# Patient Record
Sex: Male | Born: 2000 | Race: Black or African American | Hispanic: No | Marital: Single | Smoking: Never smoker
Health system: Southern US, Community
[De-identification: ages and names within clinical notes are randomized; demographics above are authoritative.]

---
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
## 2019-10-22 IMAGING — CT CT RENAL STONE PROTOCOL
2 of 4 series · 17 of 46 positions shown, 19 images · non-contrast
Comparison: None.

CLINICAL DATA: Left flank pain times 1 day.

EXAM:
CT ABDOMEN AND PELVIS WITHOUT CONTRAST
TECHNIQUE: Multidetector CT imaging of the abdomen and pelvis was performed
following the standard protocol without IV contrast.

[Series 2: stone full standard · axial · 0.80mm/px · z∈[-505,-45]mm · 14 of 102 slices shown, 16 images]
[im 5/102  soft-tissue]
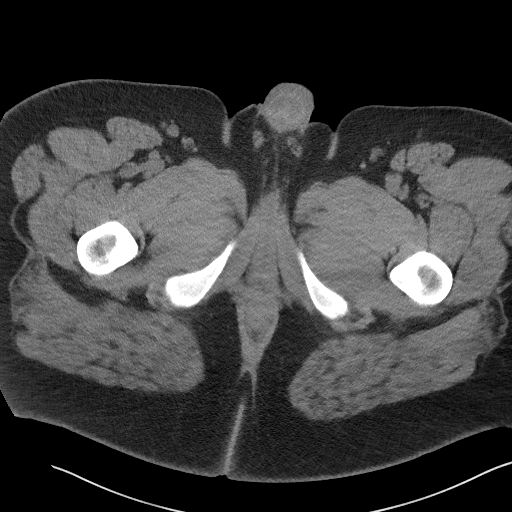
[im 5/102  bone]
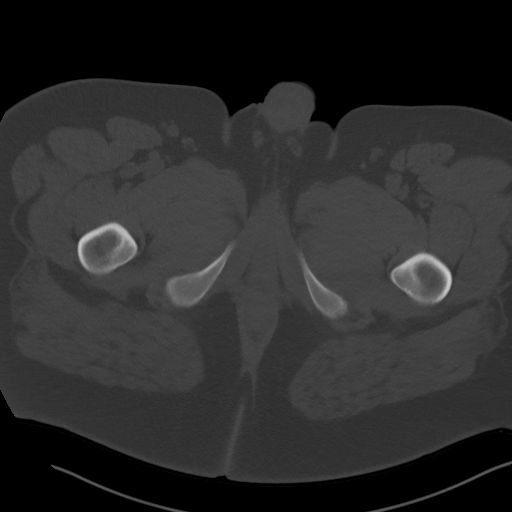
[im 13/102  soft-tissue]
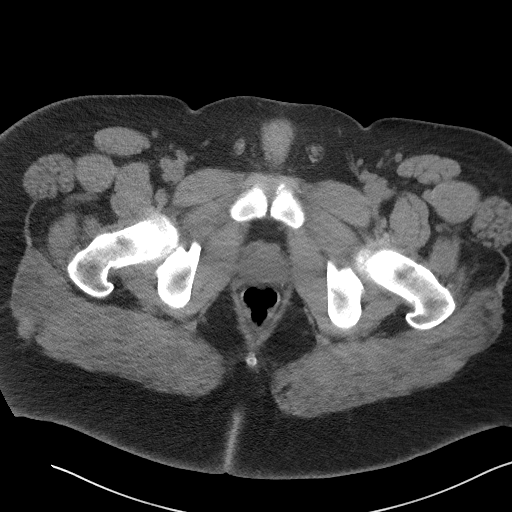
[im 21/102  soft-tissue]
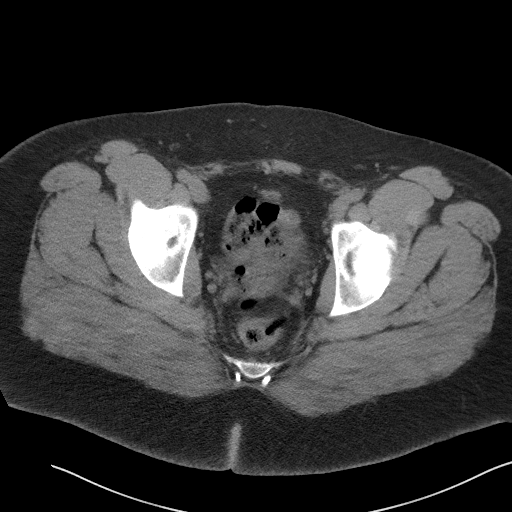
[im 29/102  soft-tissue]
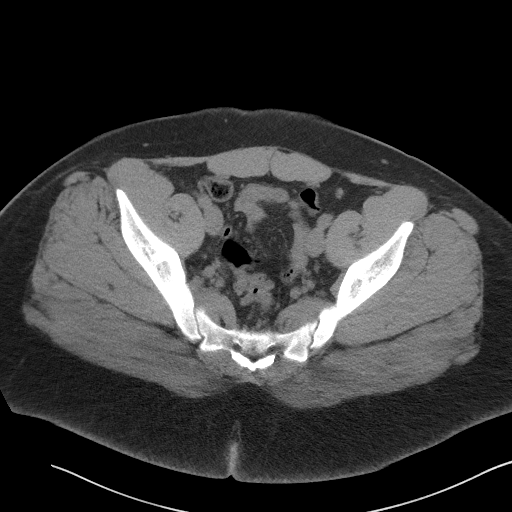
[im 33/102  soft-tissue]
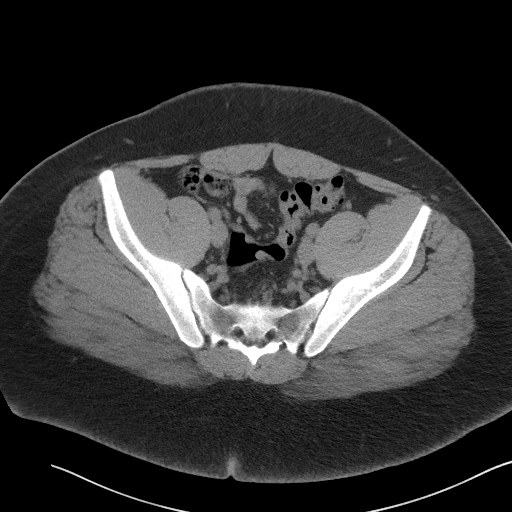
[im 41/102  soft-tissue]
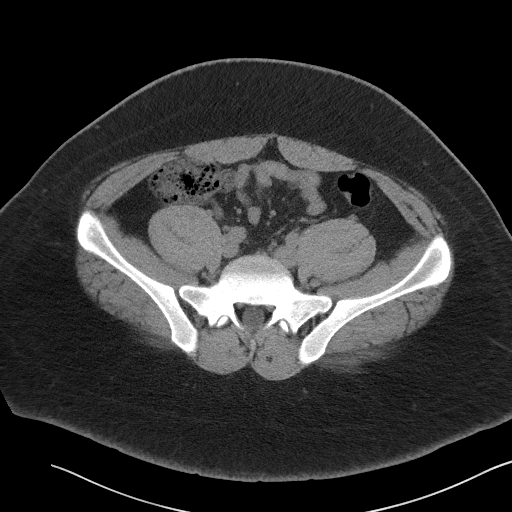
[im 49/102  soft-tissue]
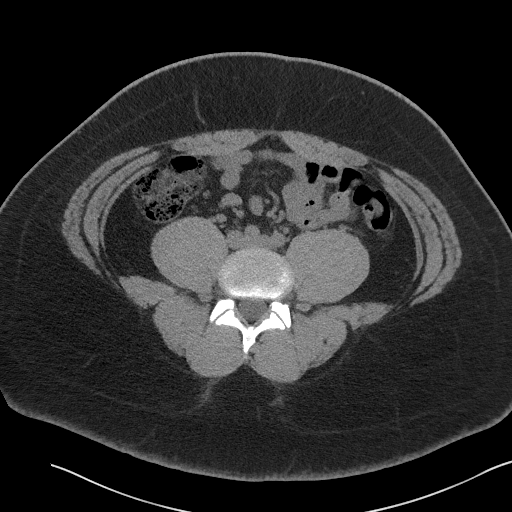
[im 53/102  soft-tissue]
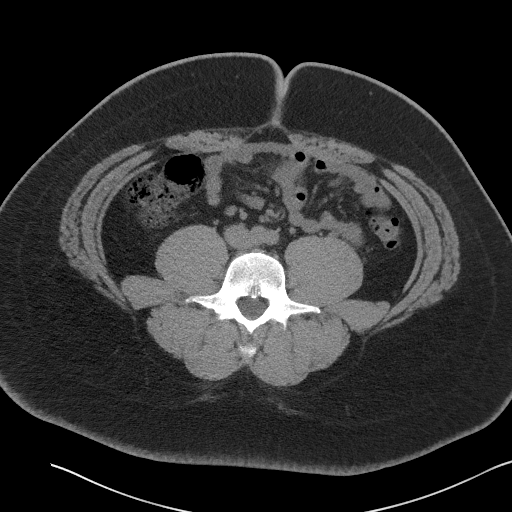
[im 61/102  soft-tissue]
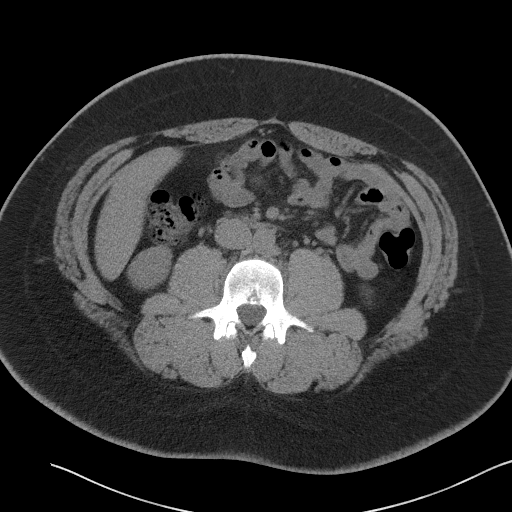
[im 61/102  bone]
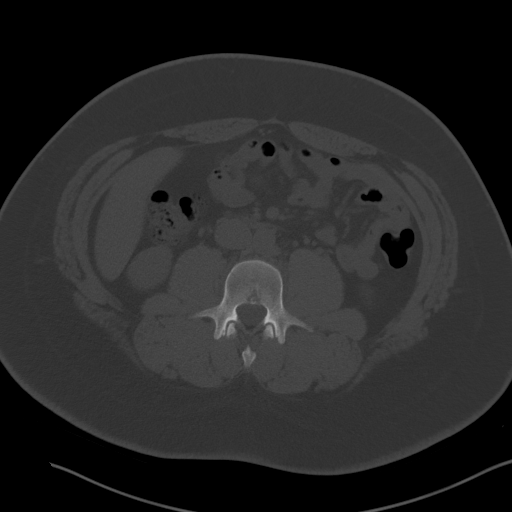
[im 69/102  soft-tissue]
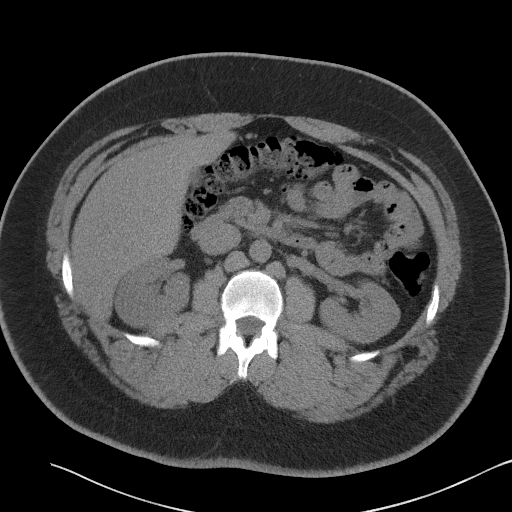
[im 77/102  soft-tissue]
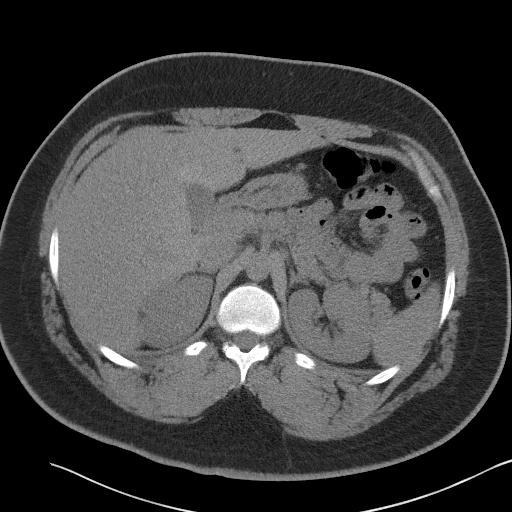
[im 81/102  soft-tissue]
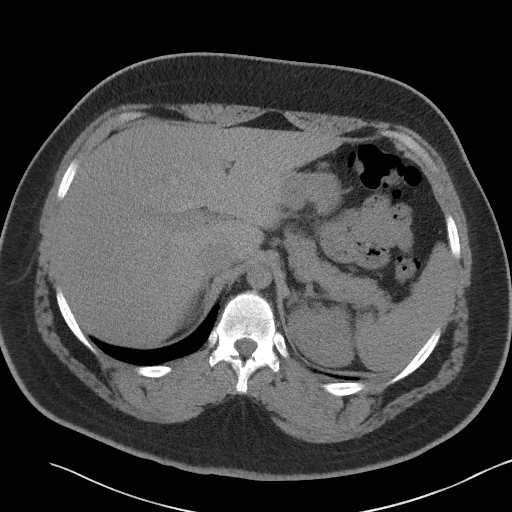
[im 89/102  soft-tissue]
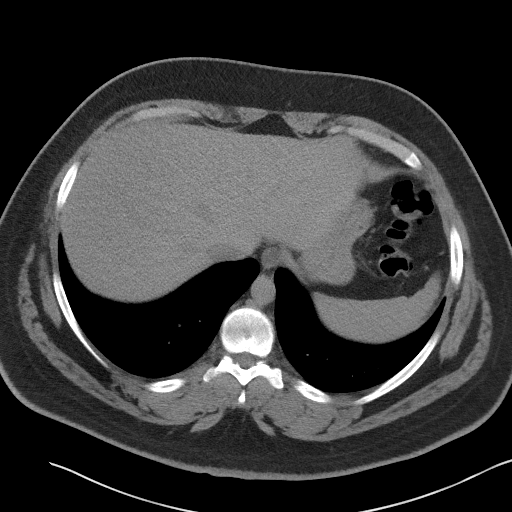
[im 97/102  soft-tissue]
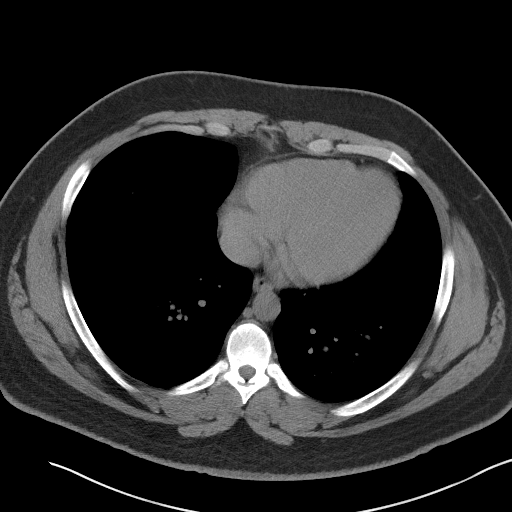

[Series 5: coronal · coronal · 0.98mm/px · 3 of 174 slices shown]
[im 58/174  soft-tissue]
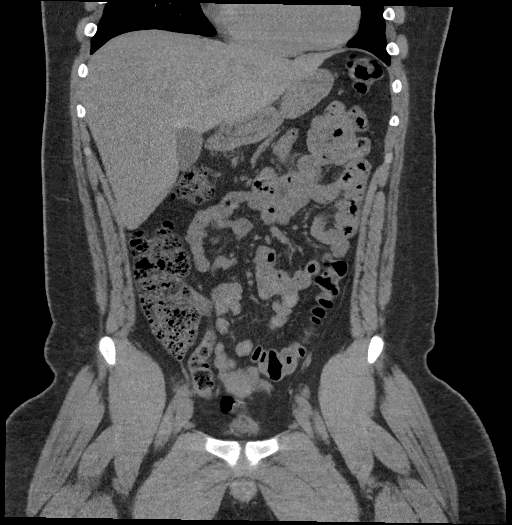
[im 77/174  soft-tissue]
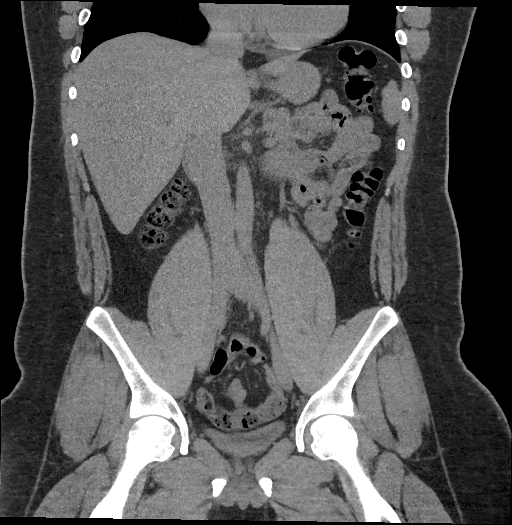
[im 97/174  soft-tissue]
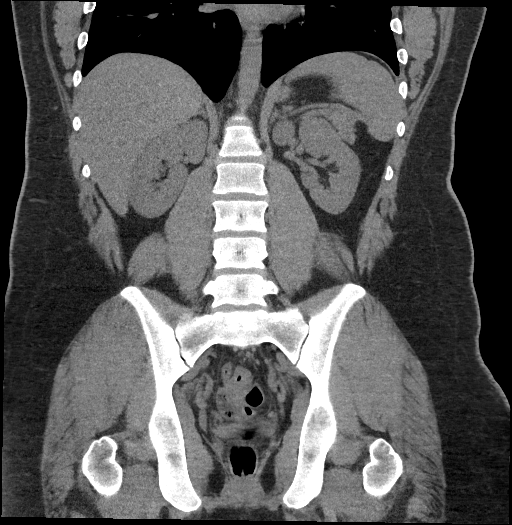

[17 of 46 positions shown; findings below may reference images not displayed]

FINDINGS: Lower chest: No acute abnormality.

Hepatobiliary: No focal liver abnormality is seen. No gallstones,
gallbladder wall thickening, or biliary dilatation.

Pancreas: Unremarkable. No pancreatic ductal dilatation or
surrounding inflammatory changes.

Spleen: Normal in size without focal abnormality.

Adrenals/Urinary Tract: Adrenal glands are unremarkable. Kidneys are
normal, without renal calculi, focal lesion, or hydronephrosis.
Bladder is unremarkable.

Stomach/Bowel: Stomach is within normal limits. Appendix appears
normal. Moderate colonic stool burden within the colon from cecum to
mid descending. No evidence of bowel wall thickening, distention, or
inflammatory changes.

Vascular/Lymphatic: No significant vascular findings are present. No
enlarged abdominal or pelvic lymph nodes.

Reproductive: Prostate is unremarkable.

Other: No abdominal wall hernia or abnormality. No abdominopelvic
ascites.

Musculoskeletal: No acute or significant osseous findings.
IMPRESSION: 1. Increased colonic stool burden. No bowel obstruction or
inflammation.
2. Normal appearance of the kidneys and renal collecting system. No
nephrolithiasis.

## 2021-06-15 ENCOUNTER — Other Ambulatory Visit: Payer: Self-pay

## 2021-06-15 ENCOUNTER — Emergency Department
Admission: EM | Admit: 2021-06-15 | Discharge: 2021-06-15 | Disposition: A | Discharge status: Home / Self Care | Payer: Medicaid Other | Attending: Emergency Medicine | Admitting: Emergency Medicine

## 2021-06-15 ENCOUNTER — Encounter: Payer: Self-pay | Admitting: Emergency Medicine

## 2021-06-15 ENCOUNTER — Emergency Department: Payer: Medicaid Other

## 2021-06-15 DIAGNOSIS — Z20822 Contact with and (suspected) exposure to covid-19: Secondary | ICD-10-CM | POA: Diagnosis not present

## 2021-06-15 DIAGNOSIS — J029 Acute pharyngitis, unspecified: Secondary | ICD-10-CM | POA: Insufficient documentation

## 2021-06-15 LAB — COMPREHENSIVE METABOLIC PANEL
ALT: 16 U/L (ref 0–44)
AST: 19 U/L (ref 15–41)
Albumin: 4 g/dL (ref 3.5–5.0)
Alkaline Phosphatase: 70 U/L (ref 38–126)
Anion gap: 8 (ref 5–15)
BUN: 10 mg/dL (ref 6–20)
CO2: 27 mmol/L (ref 22–32)
Calcium: 9.3 mg/dL (ref 8.9–10.3)
Chloride: 98 mmol/L (ref 98–111)
Creatinine, Ser: 1.08 mg/dL (ref 0.61–1.24)
GFR, Estimated: >60 mL/min (ref >60)
Glucose, Bld: 119 mg/dL — ABNORMAL HIGH (ref 70–99)
Potassium: 3.5 mmol/L (ref 3.5–5.1)
Sodium: 133 mmol/L — ABNORMAL LOW (ref 135–145)
Total Bilirubin: 0.5 mg/dL (ref 0.3–1.2)
Total Protein: 8.1 g/dL (ref 6.5–8.1)

## 2021-06-15 LAB — CBC
HCT: 42.1 % (ref 39.0–52.0)
Hemoglobin: 14.1 g/dL (ref 13.0–17.0)
MCH: 27.6 pg (ref 26.0–34.0)
MCHC: 33.5 g/dL (ref 30.0–36.0)
MCV: 82.5 fL (ref 80.0–100.0)
Platelets: 354 10*3/uL (ref 150–400)
RBC: 5.1 MIL/uL (ref 4.22–5.81)
RDW: 12.4 % (ref 11.5–15.5)
WBC: 9.1 10*3/uL (ref 4.0–10.5)
nRBC: 0 % (ref 0.0–0.2)

## 2021-06-15 LAB — RESP PANEL BY RT-PCR (FLU A&B, COVID) ARPGX2
Influenza A by PCR: NEGATIVE
Influenza B by PCR: NEGATIVE
SARS Coronavirus 2 by RT PCR: NEGATIVE

## 2021-06-15 LAB — GROUP A STREP BY PCR: Group A Strep by PCR: NOT DETECTED

## 2021-06-15 MED ORDER — IOHEXOL 300 MG/ML  SOLN
75.0000 mL | Freq: Once | INTRAMUSCULAR | Status: AC | PRN
  Administered 2021-06-15: 75 mL via INTRAVENOUS

## 2021-06-15 MED ORDER — CEPHALEXIN 500 MG PO CAPS: 500.0000 mg | ORAL_CAPSULE | Freq: Three times a day (TID) | ORAL | 0 refills | Status: AC

## 2021-06-15 NOTE — ED Triage Notes (Signed)
Pt arrived via POV with reports of pain when swallowing on the R side, pt states the pain in his throat started today, pt states 1 week ago at urgent care he tested + for the flu and - for strep.   Pt was also told that he had tonsil stones as well.

## 2021-06-15 NOTE — Discharge Instructions (Addendum)
Take the antibiotic 1 pill 3 times a day.  Use sore throat lozenges or sore throat gargles.  Return here if you are worse and follow-up with your doctor in about a week if you are not well.  Tylenol or Motrin may work for the pain as well.  If it is hard to swallow you can try the liquid Children's Motrin.  You can take up to 6 or 800 mg of Motrin 3 times a day.  The liquid Motrin for kids is 100 mg/tsp. so you can take 6 or 8 teaspoons of that a day if you want or if she can swallow pills 3 or 4 of the over-the-counter pills 3 times a day.  Do not take the Motrin for more than 3 to 4 days ago.  Be careful can upset your stomach.  Try taking with food.

## 2021-06-15 NOTE — ED Provider Notes (Signed)
----------------------------------------- °  8:18 AM on 06/15/2021 ----------------------------------------- Patient CT comes back showing only some possible sinusitis and pharyngitis.  Tonsillitis.  On exam patient's right tonsil is very large and looks somewhat covered with exudate.  Strep test is negative but because of the appearance of the tonsil I will give him some Keflex.  Patient will return if worse and follow-up with his doctor if no better in a week.  He will use Tylenol or Motrin for pain and sore throat lozenges and salt water gargles.  He knows not to swallow the salt water.   Arnaldo Natal, MD 06/15/21 [phone]

## 2021-06-15 NOTE — ED Provider Notes (Signed)
[facility] Provider Note    Event Date/Time   First MD Initiated Contact with Patient 06/15/21 [phone]     (approximate)   History   Sore Throat   HPI  Phillip Dudley is a 21 y.o. male who reports no chronic medical issues and presents for evaluation of of sore throat and feeling like something is stuck in his throat.  Recently tested positive for influenza as an outpatient.  His sore throat and foreign body sensation in the right side of his throat has continued to get worse.  He thought he should get it checked out.  His voice is not hoarse and he is not having difficulty swallowing although it is painful to do so.  No fever.  No difficulty breathing.  No swelling in his mouth or his throat.     Physical Exam   Triage Vital Signs: ED Triage Vitals  Enc Vitals Group     BP 06/15/21 0237 (!) 159/86     Pulse Rate 06/15/21 0237 92     Resp 06/15/21 0237 18     Temp 06/15/21 0237 98.6 F (37 C)     Temp Source 06/15/21 0237 Oral     SpO2 06/15/21 0237 100 %     Weight 06/15/21 0237 (!) 161.9 kg (357 lb)     Height 06/15/21 0237 1.905 m (6\' 3" )     Head Circumference --      Peak Flow --      Pain Score 06/15/21 0244 6     Pain Loc --      Pain Edu? --      Excl. in [place]? --     Most recent vital signs: Vitals:   06/15/21 0237 06/15/21 0526  BP: (!) 159/86 139/83  Pulse: 92 73  Resp: 18 18  Temp: 98.6 F (37 C) 99.1 F (37.3 C)  SpO2: 100% 94%     General: Awake, no distress.  CV:  Good peripheral perfusion.  Resp:  Normal effort.  Normal voice, no dysphonia is evident.   Abd:  No distention.  Other:  No obvious facial nor neck swelling.  Some tenderness to palpation beneath the right side of his mandible.  Oromucosa is moist.  There is some erythema of his tonsils with tonsillar hypertrophy which appears roughly equal bilaterally although the right tonsil may be slightly bigger than the left.  There are some tonsillitis.  No gross  exudate.  No airway compromise.   ED Results / Procedures / Treatments   Labs (all labs ordered are listed, but only abnormal results are displayed) Labs Reviewed  COMPREHENSIVE METABOLIC PANEL - Abnormal; Notable for the following components:      Result Value   Sodium 133 (*)    Glucose, Bld 119 (*)    All other components within normal limits  GROUP A STREP BY PCR  RESP PANEL BY RT-PCR (FLU A&B, COVID) [id number]  CBC    RADIOLOGY CT scan soft tissue neck pending at time of transfer of care.    PROCEDURES:  Critical Care performed: No  Procedures   MEDICATIONS ORDERED IN ED: Medications - No data to display   IMPRESSION / MDM / [place] / ED COURSE  I reviewed the triage vital signs and the nursing notes.                              Differential  diagnosis includes, but is not limited to, sequela of influenza infection, other nonspecific tonsillitis, epiglottitis, laryngitis, peritonsillar abscess.  The patient is generally well-appearing and has normal vital signs.  However, given his report of feeling like something is stabbing him in the neck and there being a fullness in his throat, I will evaluate with a CT scan to make sure there is no early phlegmon or abscess that would require antibiotics.  Otherwise if this is just sequela of the flu, he can be treated conservatively without antibiotics.  Labs ordered in triage include respiratory viral panel, comprehensive metabolic panel, CBC, and group A strep.  Group A strep is negative, respiratory viral panel is negative, CBC is within normal limits, comprehensive metabolic panel is essentially normal other than very mild hyponatremia which is not clinically relevant.  Patient agrees with the plan for the CT scan.        Clinical Course as of 06/15/21 0741  Nancy Fetter Jun 15, 2021  0633 Resp Panel by RT-PCR (Flu A&B, Covid) Nasopharyngeal Swab Negative respiratory viral panel including COVID, influenza A, and  influenza B [CF]  [id number] Transferring ED care to Dr. Cinda Quest to follow-up on CT scan results and treat/disposition appropriately. [CF]    Clinical Course User Index [CF] Hinda Kehr, MD     FINAL CLINICAL IMPRESSION(S) / ED DIAGNOSES   Final diagnoses:  None     Rx / DC Orders   ED Discharge Orders     None        Note:  This document was prepared using Dragon voice recognition software and may include unintentional dictation errors.   Hinda Kehr, MD 06/15/21 [phone]
